# Patient Record
Sex: Female | Born: 1955 | Hispanic: Yes | Smoking: Never smoker
Health system: Southern US, Community
[De-identification: ages and names within clinical notes are randomized; demographics above are authoritative.]

## PROBLEM LIST (undated history)

## (undated) DIAGNOSIS — I011 Acute rheumatic endocarditis: Secondary | ICD-10-CM

## (undated) DIAGNOSIS — M1990 Unspecified osteoarthritis, unspecified site: Secondary | ICD-10-CM

---

## 2021-10-20 ENCOUNTER — Emergency Department (HOSPITAL_BASED_OUTPATIENT_CLINIC_OR_DEPARTMENT_OTHER): Payer: Self-pay

## 2021-10-20 ENCOUNTER — Encounter (HOSPITAL_BASED_OUTPATIENT_CLINIC_OR_DEPARTMENT_OTHER): Payer: Self-pay | Admitting: Emergency Medicine

## 2021-10-20 ENCOUNTER — Other Ambulatory Visit: Payer: Self-pay

## 2021-10-20 ENCOUNTER — Emergency Department (HOSPITAL_BASED_OUTPATIENT_CLINIC_OR_DEPARTMENT_OTHER)
Admission: EM | Admit: 2021-10-20 | Discharge: 2021-10-21 | Disposition: A | Discharge status: Home / Self Care | Payer: Self-pay | Attending: Emergency Medicine | Admitting: Emergency Medicine

## 2021-10-20 DIAGNOSIS — I7090 Unspecified atherosclerosis: Secondary | ICD-10-CM

## 2021-10-20 DIAGNOSIS — M5431 Sciatica, right side: Secondary | ICD-10-CM | POA: Insufficient documentation

## 2021-10-20 HISTORY — DX: Unspecified osteoarthritis, unspecified site: M19.90

## 2021-10-20 HISTORY — DX: Acute rheumatic endocarditis: I01.1

## 2021-10-20 MED ORDER — OXYCODONE-ACETAMINOPHEN 5-325 MG PO TABS
1.0000 | ORAL_TABLET | ORAL | Status: DC | PRN
  Administered 2021-10-20: 23:00:00 1 via ORAL
  Filled 2021-10-20: qty 1

## 2021-10-20 NOTE — ED Triage Notes (Signed)
Per pt family pt has pain in right hip started yesterday morning. Has Hx of arthritis but has never been this painful. Denies injury but has been more active than normal, as she is here in the [place] on vacation.

## 2021-10-21 MED ORDER — KETOROLAC TROMETHAMINE 15 MG/ML IJ SOLN
15.0000 mg | Freq: Once | INTRAMUSCULAR | Status: AC
  Administered 2021-10-21: 15 mg via INTRAMUSCULAR
  Filled 2021-10-21: qty 1

## 2021-10-21 MED ORDER — ACETAMINOPHEN 325 MG PO TABS
650.0000 mg | ORAL_TABLET | Freq: Once | ORAL | Status: AC
  Administered 2021-10-21: 01:00:00 650 mg via ORAL
  Filled 2021-10-21: qty 2

## 2021-10-21 MED ORDER — DIAZEPAM 2 MG PO TABS
2.0000 mg | ORAL_TABLET | Freq: Once | ORAL | Status: AC
  Administered 2021-10-21: 01:00:00 2 mg via ORAL
  Filled 2021-10-21: qty 1

## 2021-10-21 MED ORDER — OXYCODONE HCL 5 MG PO TABS
5.0000 mg | ORAL_TABLET | Freq: Once | ORAL | Status: AC
  Administered 2021-10-21: 5 mg via ORAL
  Filled 2021-10-21: qty 1

## 2021-10-21 NOTE — ED Provider Notes (Signed)
[facility] EMERGENCY DEPARTMENT Provider Note   CSN: [id number] Arrival date & time: 10/20/21  2224     History Chief Complaint  Patient presents with   Hip Pain    Kathleen Whitney is a 65 y.o. female.  65 yo F with a chief complaints of right leg pain.  Goes from her right buttock down to the knee.  Pain is on the outside of the leg.  Worse with movement twisting palpation.  Going on for a couple days now.  Has a history of back surgery in the past.  Has had some pain off and on to this area.  Recently traveled from out of the country to visit for Christmas.  She denies trauma denies fevers denies loss of bowel or bladder denies loss of rectal sensation denies numbness or weakness in the leg.  The history is provided by the patient. The history is limited by a language barrier. A language interpreter was used.  Hip Pain Pertinent negatives include no chest pain, no headaches and no shortness of breath.  Illness Severity:  Moderate Onset quality:  Gradual Duration:  2 days Timing:  Constant Progression:  Worsening Chronicity:  Recurrent Associated symptoms: myalgias   Associated symptoms: no chest pain, no congestion, no fever, no headaches, no nausea, no rhinorrhea, no shortness of breath, no vomiting and no wheezing       Past Medical History:  Diagnosis Date   Arthritis    Rheumatoid aortitis     Patient Active Problem List   Diagnosis Date Noted   Atherosclerosis 10/20/2021    Past Surgical History:  Procedure Laterality Date   BACK SURGERY     CESAREAN SECTION     THYROIDECTOMY       OB History   No obstetric history on file.     History reviewed. No pertinent family history.  Social History   Tobacco Use   Smoking status: Never   Smokeless tobacco: Never  Substance Use Topics   Alcohol use: Never   Drug use: Never    Home Medications Prior to Admission medications   Not on File    Allergies    Patient has no known  allergies.  Review of Systems   Review of Systems  Constitutional:  Negative for chills and fever.  HENT:  Negative for congestion and rhinorrhea.   Eyes:  Negative for redness and visual disturbance.  Respiratory:  Negative for shortness of breath and wheezing.   Cardiovascular:  Negative for chest pain and palpitations.  Gastrointestinal:  Negative for nausea and vomiting.  Genitourinary:  Negative for dysuria and urgency.  Musculoskeletal:  Positive for arthralgias and myalgias.  Skin:  Negative for pallor and wound.  Neurological:  Negative for dizziness and headaches.   Physical Exam Updated Vital Signs BP 137/66 (BP Location: Right Arm)    Pulse 85    Temp 97.7 F (36.5 C) (Oral)    Resp 16    Ht 5' 2.99" (1.6 m)    Wt 61 kg    SpO2 98%    BMI 23.83 kg/m   Physical Exam Vitals and nursing note reviewed.  Constitutional:      General: She is not in acute distress.    Appearance: She is well-developed. She is not diaphoretic.  HENT:     Head: Normocephalic and atraumatic.  Eyes:     Pupils: Pupils are equal, round, and reactive to light.  Cardiovascular:     Rate and Rhythm:  Normal rate and regular rhythm.     Heart sounds: No murmur heard.   No friction rub. No gallop.  Pulmonary:     Effort: Pulmonary effort is normal.     Breath sounds: No wheezing or rales.  Abdominal:     General: There is no distension.     Palpations: Abdomen is soft.     Tenderness: There is no abdominal tenderness.  Musculoskeletal:        General: No tenderness.     Cervical back: Normal range of motion and neck supple.     Comments: She points to her right piriformis as the area of discomfort there is no pain reproducible on palpation.  Pulse motor and sensation intact to the right lower extremity.  No pain with internal and external rotation of the hip.  Negative straight leg raise test.  No midline tenderness deformities or step-offs.  No obvious paravertebral muscular tenderness.   Skin:    General: Skin is warm and dry.  Neurological:     Mental Status: She is alert and oriented to person, place, and time.  Psychiatric:        Behavior: Behavior normal.    ED Results / Procedures / Treatments   Labs (all labs ordered are listed, but only abnormal results are displayed) Labs Reviewed - No data to display  EKG None  Radiology DG Hip Unilat  With Pelvis 2-3 Views Right  Result Date: 10/20/2021 CLINICAL DATA:  Right hip pain. EXAM: DG HIP (WITH OR WITHOUT PELVIS) 2-3V RIGHT COMPARISON:  None. FINDINGS: There is no evidence of hip fracture or dislocation. Mild degenerative changes seen in the form of joint space narrowing and acetabular sclerosis. IMPRESSION: No acute osseous abnormality. Electronically Signed   By: Virgina Norfolk M.D.   On: 10/20/2021 23:45    Procedures Procedures   Medications Ordered in ED Medications  oxyCODONE-acetaminophen (PERCOCET/ROXICET) 5-325 MG per tablet 1 tablet (1 tablet Oral Given 10/20/21 2251)  acetaminophen (TYLENOL) tablet 650 mg (650 mg Oral Given 10/21/21 0121)  oxyCODONE (Oxy IR/ROXICODONE) immediate release tablet 5 mg (5 mg Oral Given 10/21/21 0121)  diazepam (VALIUM) tablet 2 mg (2 mg Oral Given 10/21/21 0121)  ketorolac (TORADOL) 15 MG/ML injection 15 mg (15 mg Intramuscular Given 10/21/21 0121)    ED Course  I have reviewed the triage vital signs and the nursing notes.  Pertinent labs & imaging results that were available during my care of the patient were reviewed by me and considered in my medical decision making (see chart for details).    MDM Rules/Calculators/A&P                         65 yO F with a chief complaints of right-sided low back pain that radiates down the leg.  Most likely the patient has sciatica.  She is well-appearing and nontoxic.  No red flags other than age.  She had plain films obtained in the MSE process that is viewed by me without fracture.  I discussed typical course of this  problem with the patient and family.  Will treat pain here.  PCP follow-up.  1:42 AM:  I have discussed the diagnosis/risks/treatment options with the patient and family and believe the pt to be eligible for discharge home to follow-up with PCP. We also discussed returning to the ED immediately if new or worsening sx occur. We discussed the sx which are most concerning (e.g., sudden worsening pain, fever, inability  to tolerate by mouth, cauda equina s/sx) that necessitate immediate return. Medications administered to the patient during their visit and any new prescriptions provided to the patient are listed below.  Medications given during this visit Medications  oxyCODONE-acetaminophen (PERCOCET/ROXICET) 5-325 MG per tablet 1 tablet (1 tablet Oral Given 10/20/21 2251)  acetaminophen (TYLENOL) tablet 650 mg (650 mg Oral Given 10/21/21 0121)  oxyCODONE (Oxy IR/ROXICODONE) immediate release tablet 5 mg (5 mg Oral Given 10/21/21 0121)  diazepam (VALIUM) tablet 2 mg (2 mg Oral Given 10/21/21 0121)  ketorolac (TORADOL) 15 MG/ML injection 15 mg (15 mg Intramuscular Given 10/21/21 0121)     The patient appears reasonably screen and/or stabilized for discharge and I doubt any other medical condition or other [facility] requiring further screening, evaluation, or treatment in the ED at this time prior to discharge.      Final Clinical Impression(s) / ED Diagnoses Final diagnoses:  Sciatica of right side    Rx / DC Orders ED Discharge Orders     None        Deno Etienne, DO 10/21/21 0142

## 2021-10-21 NOTE — ED Notes (Signed)
Patient discharged to home.  All discharge instructions reviewed.  Patient verbalized understanding via teachback method.  VS WDL.  Respirations even and unlabored.  Wheelchair out of ED.   ?

## 2021-10-21 NOTE — Discharge Instructions (Signed)
Your back pain is most likely due to a muscular strain.  There is been a lot of research on back pain, unfortunately the only thing that seems to really help is Tylenol and ibuprofen.  Relative rest is also important to not lift greater than 10 pounds bending or twisting at the waist.  Please follow-up with your family physician.  The other thing that really seems to benefit patients is physical therapy which your doctor may send you for.  Please return to the emergency department for new numbness or weakness to your arms or legs. Difficulty with urinating or urinating or pooping on yourself.  Also if you cannot feel toilet paper when you wipe or get a fever.   Take 4 over the counter ibuprofen tablets 3 times a day or 2 over-the-counter naproxen tablets twice a day for pain. Also take tylenol 1000mg (2 extra strength) four times a day.   Lo ms probable es que su dolor de espalda se deba a una distensin muscular. Ha habido mucha investigacin sobre el dolor de espalda, desafortunadamente lo nico que parece ayudar realmente es el Tylenol y el ibuprofeno. El descanso relativo tambin es importante para no levantar ms de 10 libras doblando o torciendo la cintura. Por favor, haga un seguimiento con su mdico de familia. La otra cosa que realmente parece beneficiar a los pacientes es la fisioterapia que su mdico puede enviarle. Regrese al departamento de emergencias si tiene entumecimiento o debilidad nuevos en sus brazos o piernas. Dificultad para orinar u orinar o . Tambin si no puede sentir el papel higinico cuando se limpia o tiene fiebre.  Tome 4 tabletas de ibuprofeno de venta libre 3 veces al da o 2 tabletas de naproxeno de venta Administrator, [employer] veces al da para [address]. Tambin tome tylenol 1000 mg (2 extra fuerte) cuatro veces al Chief Technology Officer.

## 2022-06-19 ENCOUNTER — Other Ambulatory Visit: Payer: Self-pay

## 2022-06-19 ENCOUNTER — Other Ambulatory Visit (HOSPITAL_BASED_OUTPATIENT_CLINIC_OR_DEPARTMENT_OTHER): Payer: Self-pay

## 2022-06-19 ENCOUNTER — Encounter (HOSPITAL_BASED_OUTPATIENT_CLINIC_OR_DEPARTMENT_OTHER): Payer: Self-pay

## 2022-06-19 ENCOUNTER — Emergency Department (HOSPITAL_BASED_OUTPATIENT_CLINIC_OR_DEPARTMENT_OTHER): Payer: Self-pay

## 2022-06-19 ENCOUNTER — Emergency Department (HOSPITAL_BASED_OUTPATIENT_CLINIC_OR_DEPARTMENT_OTHER)
Admission: EM | Admit: 2022-06-19 | Discharge: 2022-06-19 | Disposition: A | Discharge status: Home / Self Care | Payer: Self-pay | Attending: Emergency Medicine | Admitting: Emergency Medicine

## 2022-06-19 DIAGNOSIS — J45909 Unspecified asthma, uncomplicated: Secondary | ICD-10-CM | POA: Insufficient documentation

## 2022-06-19 DIAGNOSIS — Z20822 Contact with and (suspected) exposure to covid-19: Secondary | ICD-10-CM | POA: Insufficient documentation

## 2022-06-19 DIAGNOSIS — Z7951 Long term (current) use of inhaled steroids: Secondary | ICD-10-CM | POA: Insufficient documentation

## 2022-06-19 DIAGNOSIS — J069 Acute upper respiratory infection, unspecified: Secondary | ICD-10-CM | POA: Insufficient documentation

## 2022-06-19 LAB — RESP PANEL BY RT-PCR (FLU A&B, COVID) ARPGX2
Influenza A by PCR: NEGATIVE
Influenza B by PCR: NEGATIVE
SARS Coronavirus 2 by RT PCR: NEGATIVE

## 2022-06-19 MED ORDER — GUAIFENESIN-CODEINE 100-10 MG/5ML PO SOLN
5.0000 mL | Freq: Every evening | ORAL | 0 refills | Status: AC | PRN
  Filled 2022-06-19: qty 118, 23d supply, fill #0

## 2022-06-19 MED ORDER — IPRATROPIUM-ALBUTEROL 0.5-2.5 (3) MG/3ML IN SOLN
3.0000 mL | Freq: Once | RESPIRATORY_TRACT | Status: AC
  Administered 2022-06-19: 3 mL via RESPIRATORY_TRACT
  Filled 2022-06-19: qty 3

## 2022-06-19 MED ORDER — ALBUTEROL SULFATE (2.5 MG/3ML) 0.083% IN NEBU
2.5000 mg | INHALATION_SOLUTION | Freq: Four times a day (QID) | RESPIRATORY_TRACT | 12 refills | Status: AC | PRN
  Filled 2022-06-19: qty 75, 7d supply, fill #0

## 2022-06-19 MED ORDER — FLUTICASONE PROPIONATE 50 MCG/ACT NA SUSP
1.0000 | Freq: Every day | NASAL | 0 refills | Status: AC
  Filled 2022-06-19: qty 16, 60d supply, fill #0

## 2022-06-19 NOTE — Discharge Instructions (Addendum)
It was a pleasure caring for you today in the emergency department.  Please return to the emergency department for any worsening or worrisome symptoms.  You have been seen in the Emergency Department (ED) today for a likely viral illness.  Please drink plenty of clear fluids (water, Gatorade, chicken broth, etc).  You may use Tylenol and/or Motrin according to label instructions.  You can alternate between the two without any side effects.   Please follow up with your doctor as listed above.  Call your doctor or return to the Emergency Department (ED) if you are unable to tolerate fluids due to vomiting, have worsening trouble breathing, become extremely tired or difficult to awaken, or if you develop any other symptoms that concern you.

## 2022-06-19 NOTE — ED Triage Notes (Signed)
Cough x 5 days. Started with bodyaches and chills. Continues to have productive cough and shortness of breath & chest tightness. Hx of asthma.

## 2022-06-19 NOTE — Progress Notes (Signed)
Upon assessment, Patient has a coarse sounding cough. Breath sounds are clear and she is in no distress. Rt will continue to monitor

## 2022-06-19 NOTE — ED Notes (Signed)
Portable XRAY at bedside.

## 2022-06-19 NOTE — ED Provider Notes (Signed)
[facility] EMERGENCY DEPARTMENT Provider Note   CSN: [id number] Arrival date & time: 06/19/22  8938     History {Add pertinent medical, surgical, social history, OB history to HPI:1} Chief Complaint  Patient presents with   Cough    Kathleen Whitney is a 66 y.o. female.  Patient as above with significant medical history as below, including asthma, arthritis who presents to the ED with complaint of cough, congestion, body aches, mild dyspnea.  Patient is traveling from outside the country.  History of asthma.  Compliant with her home inhalers including ipratropium and beclomethasone.  Symptoms began 5 days ago with body aches, chills, no fever, postnasal drip.  Body aches and chills have resolved but cough and postnasal drip has persisted.  Been taking Tylenol with improvement of symptoms other than cough.  Cough is worse at nighttime, productive with clear sputum.  She is having postnasal drip.  Rhinorrhea which is also clear.  No fevers.  No vomiting.  She is tolerating p.o. intake without difficulty.  No abdominal pain no rashes.  No change to bowel or bladder activity.  Feels she is breathing appropriately.     Past Medical History:  Diagnosis Date   Arthritis    Rheumatoid aortitis     Past Surgical History:  Procedure Laterality Date   BACK SURGERY     CESAREAN SECTION     THYROIDECTOMY       The history is provided by the patient and a relative. The history is limited by a language barrier. No language interpreter was used (Interpretation services offered however patient prefers to have daughter translate for her).  Cough Associated symptoms: chills and rhinorrhea   Associated symptoms: no chest pain, no eye discharge, no fever, no headaches, no rash and no shortness of breath        Home Medications Prior to Admission medications   Medication Sig Start Date End Date Taking? Authorizing Provider  fluticasone (FLONASE) 50 MCG/ACT nasal spray Place  1 spray into both nostrils daily for 7 days. 06/19/22 06/26/22 Yes Tanda Rockers A, DO  guaiFENesin-codeine 100-10 MG/5ML syrup Take 5 mLs by mouth at bedtime as needed for cough. 06/19/22  Yes Sloan Leiter, DO      Allergies    Patient has no known allergies.    Review of Systems   Review of Systems  Constitutional:  Positive for chills. Negative for activity change and fever.  HENT:  Positive for congestion, postnasal drip and rhinorrhea. Negative for facial swelling and trouble swallowing.   Eyes:  Negative for discharge and redness.  Respiratory:  Positive for cough. Negative for shortness of breath.   Cardiovascular:  Negative for chest pain and palpitations.  Gastrointestinal:  Negative for abdominal pain and nausea.  Genitourinary:  Negative for dysuria and flank pain.  Musculoskeletal:  Positive for arthralgias. Negative for back pain and gait problem.  Skin:  Negative for pallor and rash.  Neurological:  Negative for syncope and headaches.    Physical Exam Updated Vital Signs BP 129/79   Pulse (!) 112   Temp 98.2 F (36.8 C) (Oral)   Resp 18   Ht 5' 4.17" (1.63 m)   Wt 57 kg   SpO2 99%   BMI 21.45 kg/m  Physical Exam Vitals and nursing note reviewed.  Constitutional:      General: She is not in acute distress.    Appearance: Normal appearance.  HENT:     Head: Normocephalic and atraumatic.  Right Ear: External ear normal.     Left Ear: External ear normal.     Nose: Nose normal.     Mouth/Throat:     Mouth: Mucous membranes are moist.  Eyes:     General: No scleral icterus.       Right eye: No discharge.        Left eye: No discharge.  Cardiovascular:     Rate and Rhythm: Normal rate and regular rhythm.     Pulses: Normal pulses.     Heart sounds: Normal heart sounds.  Pulmonary:     Effort: Pulmonary effort is normal. No tachypnea, accessory muscle usage or respiratory distress.     Breath sounds: Normal breath sounds. Transmitted upper airway sounds  present. No stridor. No wheezing.  Abdominal:     General: Abdomen is flat.     Tenderness: There is no abdominal tenderness.  Musculoskeletal:        General: Normal range of motion.     Cervical back: Normal range of motion.     Right lower leg: No edema.     Left lower leg: No edema.  Skin:    General: Skin is warm and dry.     Capillary Refill: Capillary refill takes less than 2 seconds.  Neurological:     Mental Status: She is alert.  Psychiatric:        Mood and Affect: Mood normal.        Behavior: Behavior normal.     ED Results / Procedures / Treatments   Labs (all labs ordered are listed, but only abnormal results are displayed) Labs Reviewed  RESP PANEL BY RT-PCR (FLU A&B, COVID) [id number]    EKG None  Radiology DG Chest Port 1 View  Result Date: 06/19/2022 CLINICAL DATA:  Cough EXAM: PORTABLE CHEST 1 VIEW COMPARISON:  None Available. FINDINGS: The cardiomediastinal silhouette is within normal limits. No pleural effusion. No pneumothorax. No mass or consolidation. Bronchial wall thickening. No acute osseous abnormality. IMPRESSION: Bronchial wall thickening, as can be seen with viral illness or reactive airways disease. No consolidative pneumonia. Electronically Signed   By: Olive Bass M.D.   On: 06/19/2022 08:30    Procedures Procedures  {Document cardiac monitor, telemetry assessment procedure when appropriate:1}  Medications Ordered in ED Medications  ipratropium-albuterol (DUONEB) 0.5-2.5 (3) MG/3ML nebulizer solution 3 mL (has no administration in time range)    ED Course/ Medical Decision Making/ A&P                           Medical Decision Making Amount and/or Complexity of Data Reviewed Radiology: ordered.  Risk OTC drugs. Prescription drug management.   This patient presents to the ED with chief complaint(s) of cough, congestion, URI symptoms with pertinent past medical history of asthma, Spanish-speaking, resides outside of the country  which further complicates the presenting complaint. The complaint involves an extensive differential diagnosis and also carries with it a high risk of complications and morbidity.    The differential diagnosis includes but not limited to viral URI, asthma exacerbation, pneumonia, sinusitis, viral infection, influenza, COVID-19, life-threatening etiologies were considered. Serious etiologies were considered.   The initial plan is to RVP, chest x-ray, DuoNeb   Additional history obtained: Additional history obtained from family Records reviewed  prior ED visit, PDMP  Independent labs interpretation:  The following labs were independently interpreted: RVP ***  Independent visualization of imaging: - I independently visualized the following  imaging with scope of interpretation limited to determining acute life threatening conditions related to emergency care: CXR, which revealed no acute p  Cardiac monitoring was reviewed and interpreted by myself which shows ?reactive airway illness vs viral  Treatment and Reassessment: duoneb  Consultation: - Consulted or discussed management/test interpretation w/ external professional: n/a  Consideration for admission or further workup: Admission was considered ***  The patient improved significantly and was discharged in stable condition. Detailed discussions were had with the patient regarding current findings, and need for close f/u with PCP or on call doctor. The patient has been instructed to return immediately if the symptoms worsen in any way for re-evaluation. Patient verbalized understanding and is in agreement with current care plan. All questions answered prior to discharge.     Social Determinants of health: Spanish speaking primarily  Social History   Tobacco Use   Smoking status: Never   Smokeless tobacco: Never  Substance Use Topics   Alcohol use: Never   Drug use: Never      {Document critical care time when  appropriate:1} {Document review of labs and clinical decision tools ie heart score, Chads2Vasc2 etc:1}  {Document your independent review of radiology images, and any outside records:1} {Document your discussion with family members, caretakers, and with consultants:1} {Document social determinants of health affecting pt's care:1} {Document your decision making why or why not admission, treatments were needed:1} Final Clinical Impression(s) / ED Diagnoses Final diagnoses:  None    Rx / DC Orders ED Discharge Orders          Ordered    guaiFENesin-codeine 100-10 MG/5ML syrup  At bedtime PRN        06/19/22 0843    fluticasone (FLONASE) 50 MCG/ACT nasal spray  Daily        06/19/22 0843

## 2023-07-31 IMAGING — DX DG HIP (WITH OR WITHOUT PELVIS) 2-3V*R*
3 series · 3 of 3 positions shown · non-contrast
Comparison: None.

CLINICAL DATA: Right hip pain.

EXAM:
DG HIP (WITH OR WITHOUT PELVIS) 2-3V RIGHT

[pelvis ap]
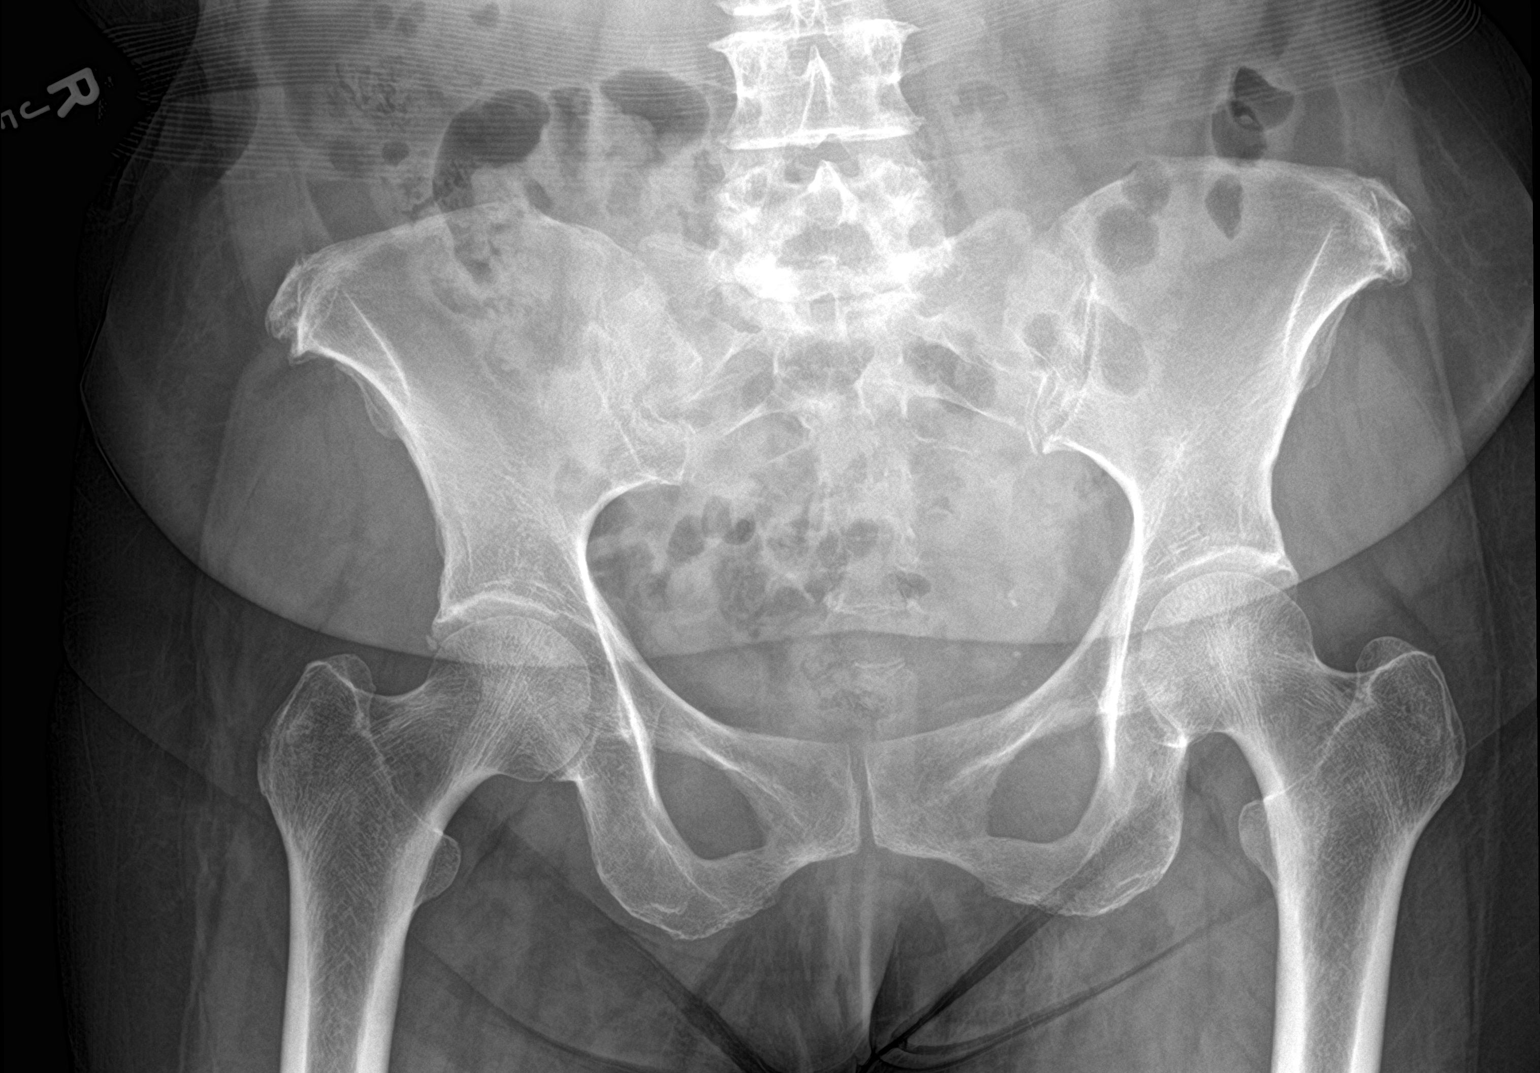

[hip ap]
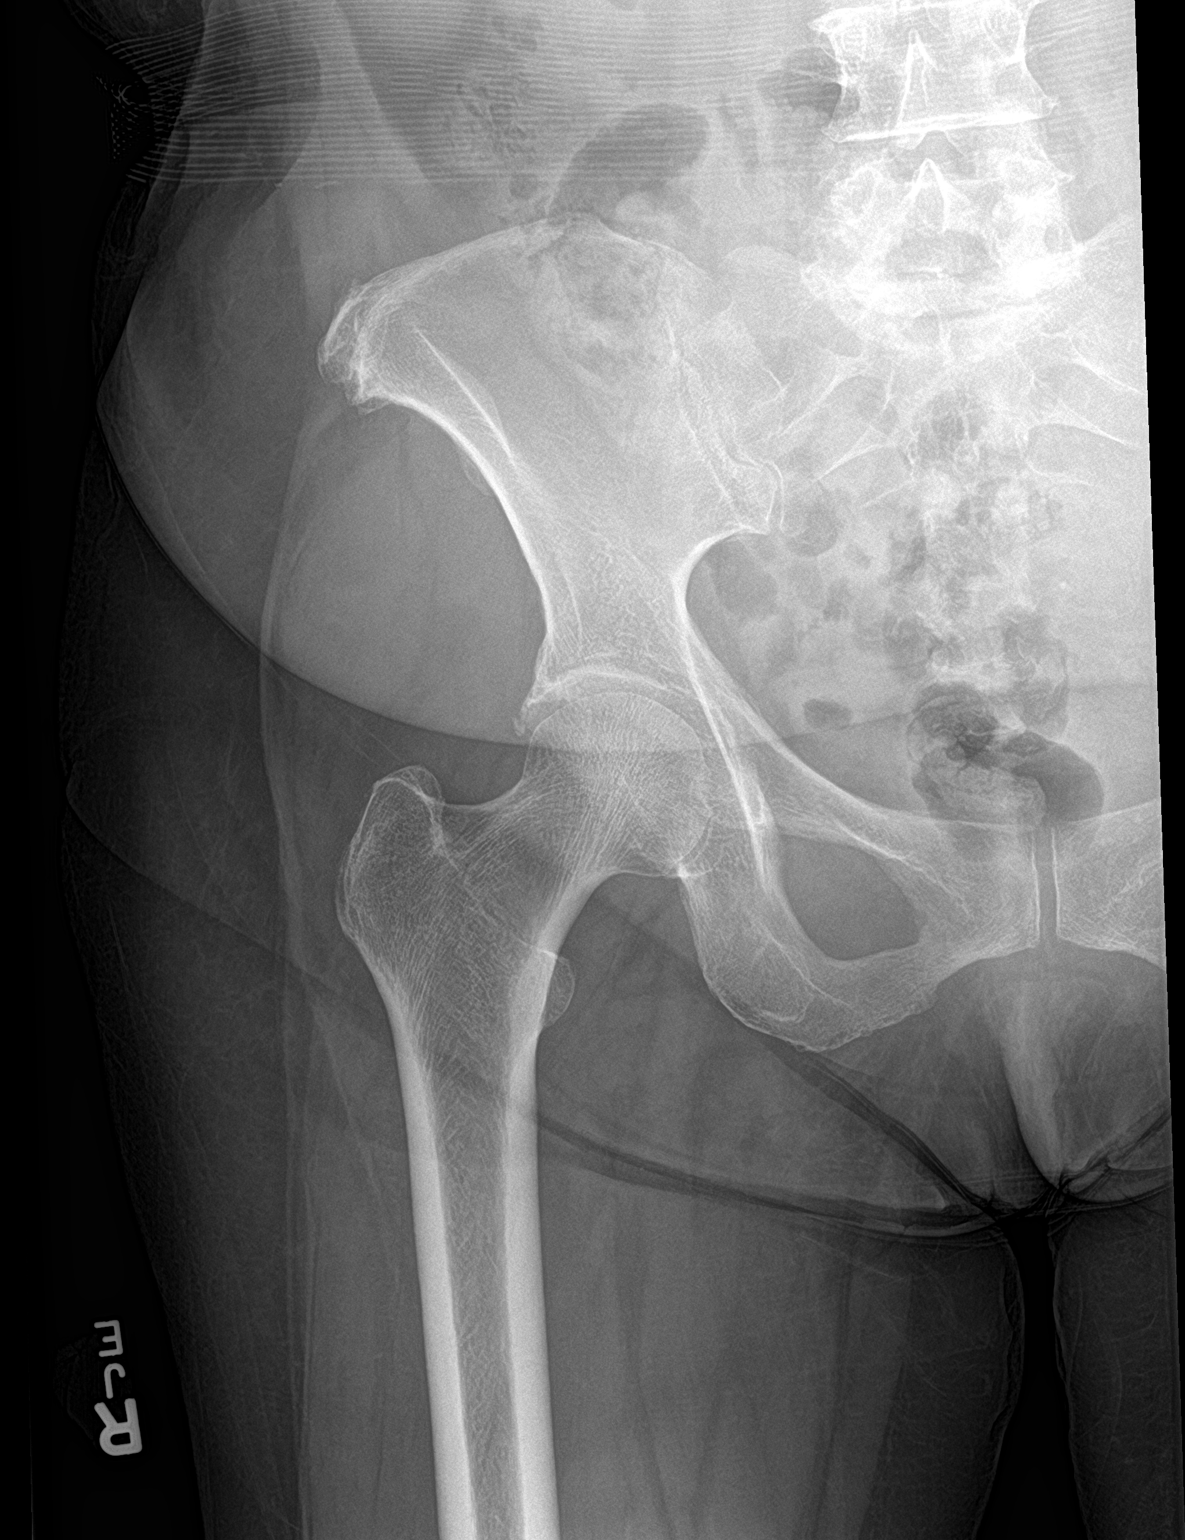

[hip lat]
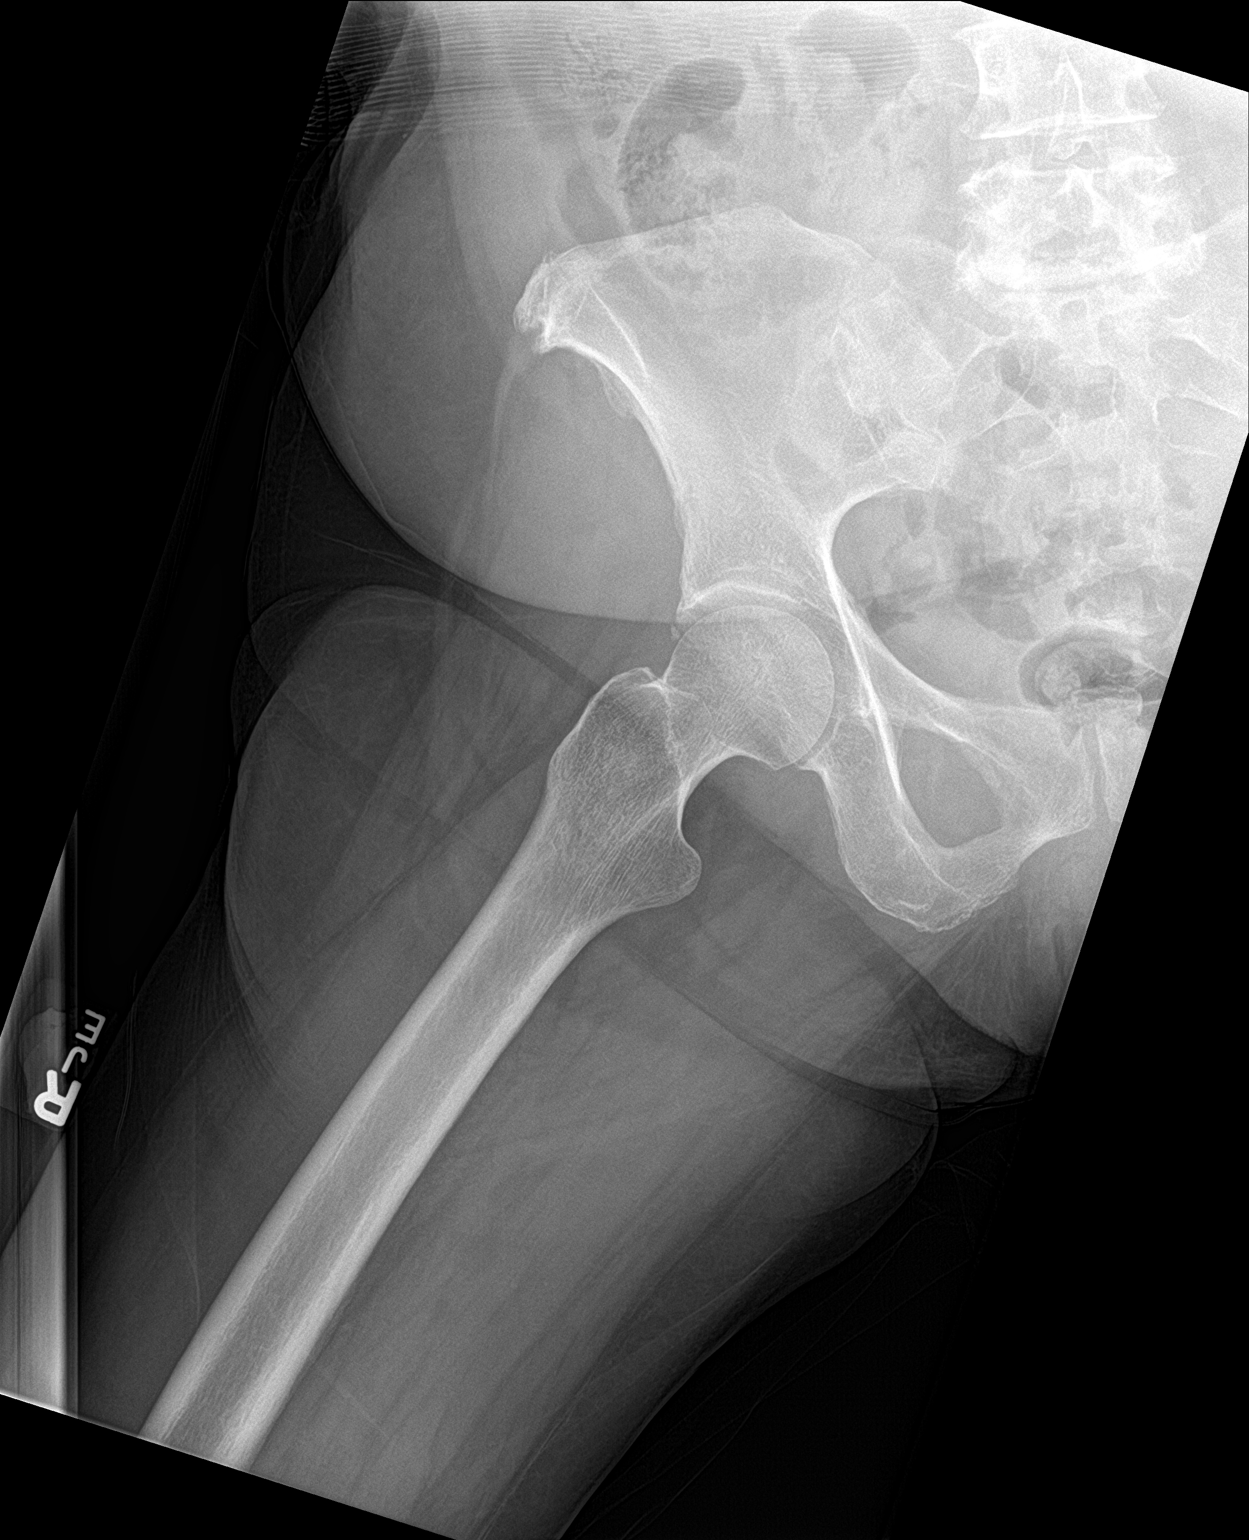

[3 of 3 positions shown; findings below may reference images not displayed]

FINDINGS: There is no evidence of hip fracture or dislocation. Mild
degenerative changes seen in the form of joint space narrowing and
acetabular sclerosis.
IMPRESSION: No acute osseous abnormality.
